# Patient Record
Sex: Male | Born: 1960 | Race: Black or African American | Hispanic: No | Marital: Married | Smoking: Never smoker
Health system: Southern US, Community
[De-identification: ages and names within clinical notes are randomized; demographics above are authoritative.]

---

## 2012-03-30 ENCOUNTER — Emergency Department (HOSPITAL_COMMUNITY)
Admission: EM | Admit: 2012-03-30 | Discharge: 2012-03-30 | Disposition: A | Discharge status: Home / Self Care | Payer: Worker's Compensation | Attending: Emergency Medicine | Admitting: Emergency Medicine

## 2012-03-30 ENCOUNTER — Emergency Department (HOSPITAL_COMMUNITY): Payer: Worker's Compensation

## 2012-03-30 ENCOUNTER — Encounter (HOSPITAL_COMMUNITY): Payer: Self-pay | Admitting: *Deleted

## 2012-03-30 DIAGNOSIS — S61412A Laceration without foreign body of left hand, initial encounter: Secondary | ICD-10-CM

## 2012-03-30 DIAGNOSIS — Y9269 Other specified industrial and construction area as the place of occurrence of the external cause: Secondary | ICD-10-CM | POA: Insufficient documentation

## 2012-03-30 DIAGNOSIS — Z23 Encounter for immunization: Secondary | ICD-10-CM | POA: Insufficient documentation

## 2012-03-30 DIAGNOSIS — S61209A Unspecified open wound of unspecified finger without damage to nail, initial encounter: Secondary | ICD-10-CM | POA: Insufficient documentation

## 2012-03-30 DIAGNOSIS — W268XXA Contact with other sharp object(s), not elsewhere classified, initial encounter: Secondary | ICD-10-CM | POA: Insufficient documentation

## 2012-03-30 LAB — RAPID URINE DRUG SCREEN, HOSP PERFORMED
Amphetamines: NOT DETECTED
Benzodiazepines: NOT DETECTED
Opiates: NOT DETECTED

## 2012-03-30 MED ORDER — TETANUS-DIPHTH-ACELL PERTUSSIS 5-2.5-18.5 LF-MCG/0.5 IM SUSP
0.5000 mL | Freq: Once | INTRAMUSCULAR | Status: AC
  Administered 2012-03-30: 0.5 mL via INTRAMUSCULAR
  Filled 2012-03-30: qty 0.5

## 2012-03-30 MED ORDER — OXYCODONE-ACETAMINOPHEN 5-325 MG PO TABS
1.0000 | ORAL_TABLET | Freq: Once | ORAL | Status: AC
  Administered 2012-03-30: 1 via ORAL
  Filled 2012-03-30: qty 1

## 2012-03-30 MED ORDER — LIDOCAINE HCL (PF) 1 % IJ SOLN: 30.0000 mL | Freq: Once | INTRAMUSCULAR | Status: DC

## 2012-03-30 MED ORDER — HYDROCODONE-ACETAMINOPHEN 5-500 MG PO TABS: 1.0000 | ORAL_TABLET | Freq: Four times a day (QID) | ORAL | Status: DC | PRN

## 2012-03-30 NOTE — ED Notes (Signed)
The pt has ha hand injury.  Lac tp the palmar surface of the lt hand.  He was at work and his hand was caught between 2 objects.????? He speaks broken  [place] .  Bleeding controlled with a bandage

## 2012-03-30 NOTE — ED Provider Notes (Signed)
History     CSN: [id number]  Arrival date & time 03/30/12  4098   First MD Initiated Contact with Patient 03/30/12 2014      Chief Complaint  Patient presents with  . Hand Injury    (Consider location/radiation/quality/duration/timing/severity/associated sxs/prior treatment) HPI  51 year old male presents with chief complaints of hand injury. 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 However, the washing machine was not working, therefore he was washing dishes by hand. He notice a stack of dishes was about to fall, when he reached his hand to try to stop it and his left thumb was caught against the edge of the machine.  Patient notice bleeding and laceration to the base of his left thumb. Also complaining of pain with movement. He described pain as a sharp throbbing sensation. He denies any other injuries.  Denies numbness. He denies wrist pain or elbow pain. He cannot recall his last tetanus shot.  History reviewed. No pertinent past medical history.  History reviewed. No pertinent past surgical history.  No family history on file.  History  Substance Use Topics  . Smoking status: Never Smoker   . Smokeless tobacco: Not on file  . Alcohol Use: No      Review of Systems  All other systems reviewed and are negative.    Allergies  Review of patient's allergies indicates no known allergies.  Home Medications  No current outpatient prescriptions on file.  BP 146/93  Pulse 58  Temp(Src) 98.2 F (36.8 C) (Oral)  Resp 14  SpO2 98%  Physical Exam  Nursing note and vitals reviewed. Constitutional: He appears well-developed and well-nourished. No distress.  HENT:  Head: Atraumatic.  Eyes: Conjunctivae are normal.  Neck: Neck supple.  Musculoskeletal:       Left hand: 1 cm horizontal laceration at the base of the thumb on the palmar region. Pain to palpation to MCP of the thumb with no obvious deformity. Sensation intact throughout. Brisk cap refill.  Decreased thumb flexion due to pain. normal strength with thumb extension. No foreign body seen so palpated. No active bleeding.  Left wrist with full range of motion, nontender. Left elbow with full range of motion, nontender.  Neurological: He is alert.  Skin: Skin is warm.    ED Course  Procedures (including critical care time)  Labs Reviewed - No data to display No results found.   No diagnosis found.  LACERATION REPAIR Performed by: Fayrene Helper Authorized byFayrene Helper Consent: Verbal consent obtained. Risks and benefits: risks, benefits and alternatives were discussed Consent given by: patient Patient identity confirmed: provided demographic data Prepped and Draped in normal sterile fashion Wound explored  Laceration Location: base of L thumb volarly  Laceration Length: 1cm  No Foreign Bodies seen or palpated  Anesthesia: local infiltration  Local anesthetic: lidocaine 2% w/out epinephrine  Anesthetic total: 3 ml  Irrigation method: syringe Amount of cleaning: standard  Skin closure: prolene 4.0  Number of sutures: 2  Technique: simple interrupted  Patient tolerance: Patient tolerated the procedure well with no immediate complications.  MDM  Laceration to base of L thumb.  Xray negative for fracture.  No evidence of tendon involvement.  Laceration were irrigated and sutured.  Wound dressing applied.  Will d/c with pain meds, and f/u instruction for suture removal.   9:51 PM This is a Worker's Comp case.  UDS obtain per request.  Pt stable to be discharge.     Fayrene Helper, PA-C 03/30/12 2127  Greta Doom  Laveda Norman, PA-C 03/30/12 2152

## 2012-03-30 NOTE — Discharge Instructions (Signed)
Return to ER in 10 days to have sutures removed.  If you notice increasing pain, pus drainage, or red streak extending from wound then return sooner.    Laceration Care, Adult A laceration is a cut or lesion that goes through all layers of the skin and into the tissue just beneath the skin. TREATMENT  Some lacerations may not require closure. Some lacerations may not be able to be closed due to an increased risk of infection. It is important to see your caregiver as soon as possible after an injury to minimize the risk of infection and maximize the opportunity for successful closure. If closure is appropriate, pain medicines may be given, if needed. The wound will be cleaned to help prevent infection. Your caregiver will use stitches (sutures), staples, wound glue (adhesive), or skin adhesive strips to repair the laceration. These tools bring the skin edges together to allow for faster healing and a better cosmetic outcome. However, all wounds will heal with a scar. Once the wound has healed, scarring can be minimized by covering the wound with sunscreen during the day for 1 full year. HOME CARE INSTRUCTIONS  For sutures or staples:  Keep the wound clean and dry.   If you were given a bandage (dressing), you should change it at least once a day. Also, change the dressing if it becomes wet or dirty, or as directed by your caregiver.   Wash the wound with soap and water 2 times a day. Rinse the wound off with water to remove all soap. Pat the wound dry with a clean towel.   After cleaning, apply a thin layer of the antibiotic ointment as recommended by your caregiver. This will help prevent infection and keep the dressing from sticking.   You may shower as usual after the first 24 hours. Do not soak the wound in water until the sutures are removed.   Only take over-the-counter or prescription medicines for pain, discomfort, or fever as directed by your caregiver.   Get your sutures or staples  removed as directed by your caregiver.  For skin adhesive strips:  Keep the wound clean and dry.   Do not get the skin adhesive strips wet. You may bathe carefully, using caution to keep the wound dry.   If the wound gets wet, pat it dry with a clean towel.   Skin adhesive strips will fall off on their own. You may trim the strips as the wound heals. Do not remove skin adhesive strips that are still stuck to the wound. They will fall off in time.  For wound adhesive:  You may briefly wet your wound in the shower or bath. Do not soak or scrub the wound. Do not swim. Avoid periods of heavy perspiration until the skin adhesive has fallen off on its own. After showering or bathing, gently pat the wound dry with a clean towel.   Do not apply liquid medicine, cream medicine, or ointment medicine to your wound while the skin adhesive is in place. This may loosen the film before your wound is healed.   If a dressing is placed over the wound, be careful not to apply tape directly over the skin adhesive. This may cause the adhesive to be pulled off before the wound is healed.   Avoid prolonged exposure to sunlight or tanning lamps while the skin adhesive is in place. Exposure to ultraviolet light in the first year will darken the scar.   The skin adhesive will usually remain  in place for 5 to 10 days, then naturally fall off the skin. Do not pick at the adhesive film.  You may need a tetanus shot if:  You cannot remember when you had your last tetanus shot.   You have never had a tetanus shot.  If you get a tetanus shot, your arm may swell, get red, and feel warm to the touch. This is common and not a problem. If you need a tetanus shot and you choose not to have one, there is a rare chance of getting tetanus. Sickness from tetanus can be serious. SEEK MEDICAL CARE IF:   You have redness, swelling, or increasing pain in the wound.   You see a red line that goes away from the wound.   You have  yellowish-white fluid (pus) coming from the wound.   You have a fever.   You notice a bad smell coming from the wound or dressing.   Your wound breaks open before or after sutures have been removed.   You notice something coming out of the wound such as wood or glass.   Your wound is on your hand or foot and you cannot move a finger or toe.  SEEK IMMEDIATE MEDICAL CARE IF:   Your pain is not controlled with prescribed medicine.   You have severe swelling around the wound causing pain and numbness or a change in color in your arm, hand, leg, or foot.   Your wound splits open and starts bleeding.   You have worsening numbness, weakness, or loss of function of any joint around or beyond the wound.   You develop painful lumps near the wound or on the skin anywhere on your body.  MAKE SURE YOU:   Understand these instructions.   Will watch your condition.   Will get help right away if you are not doing well or get worse.  Document Released: 12/17/2005 Document Revised: 12/06/2011 Document Reviewed: 06/12/2011 [facility] Patient Information 2012 [place], [place].

## 2012-03-30 NOTE — ED Provider Notes (Signed)
Medical screening examination/treatment/procedure(s) were performed by non-physician practitioner and as supervising physician I was immediately available for consultation/collaboration.  Cheri Guppy, MD 03/30/12 [phone]

## 2012-04-09 ENCOUNTER — Encounter (HOSPITAL_COMMUNITY): Payer: Self-pay | Admitting: *Deleted

## 2012-04-09 ENCOUNTER — Emergency Department (HOSPITAL_COMMUNITY)
Admission: EM | Admit: 2012-04-09 | Discharge: 2012-04-09 | Disposition: A | Discharge status: Home / Self Care | Payer: Worker's Compensation | Attending: Emergency Medicine | Admitting: Emergency Medicine

## 2012-04-09 DIAGNOSIS — Z4802 Encounter for removal of sutures: Secondary | ICD-10-CM

## 2012-04-09 NOTE — ED Provider Notes (Signed)
History     CSN: [id number]  Arrival date & time 04/09/12  1478   First MD Initiated Contact with Patient 04/09/12 2109      Chief Complaint  Patient presents with  . Suture / Staple Removal    (Consider location/radiation/quality/duration/timing/severity/associated sxs/prior treatment) HPI Comments: Patient presents for suture removal of stitches placed between left thumb and index finger 10 days ago. Patient denies complications with healing including redness, worsening pain or swelling, or drainage. No fever.  Patient is a 51 y.o. male presenting with suture removal. The history is provided by the patient.  Suture / Staple Removal  The sutures were placed 7 to 10 days ago. Treatments since wound repair include regular soap and water washings.    History reviewed. No pertinent past medical history.  History reviewed. No pertinent past surgical history.  History reviewed. No pertinent family history.  History  Substance Use Topics  . Smoking status: Never Smoker   . Smokeless tobacco: Not on file  . Alcohol Use: No      Review of Systems  Musculoskeletal: Negative for arthralgias.  Skin: Positive for wound. Negative for color change.  Neurological: Negative for weakness and numbness.    Allergies  Review of patient's allergies indicates no known allergies.  Home Medications   Current Outpatient Rx  Name Route Sig Dispense Refill  . ACETAMINOPHEN 500 MG PO TABS Oral Take 1,000 mg by mouth every 6 (six) hours as needed. For pain      BP 125/76  Pulse 62  Temp(Src) 98.9 F (37.2 C) (Oral)  Resp 18  SpO2 100%  Physical Exam  Nursing note and vitals reviewed. Constitutional: He is oriented to person, place, and time. He appears well-developed and well-nourished.  HENT:  Head: Normocephalic and atraumatic.  Eyes: Conjunctivae are normal.  Neck: Normal range of motion. Neck supple.  Pulmonary/Chest: No respiratory distress.  Musculoskeletal:       2  Prolene stitches in place and a well-healing 2 cm wound in web space between first and second fingers of left hand. No surrounding cellulitis or erythema. No drainage.  Neurological: He is alert and oriented to person, place, and time.  Skin: Skin is warm and dry.  Psychiatric: He has a normal mood and affect.    ED Course  Procedures (including critical care time)  Labs Reviewed - No data to display No results found.   1. Visit for suture removal    9:16 PM Patient seen and examined.   Vital signs reviewed and are as follows: Filed Vitals:   04/09/12 1955  BP: 125/76  Pulse: 62  Temp: 98.9 F (37.2 C)  Resp: 18   2 Prolene stitches removed from left hand between thumb and index finger. Patient tolerated well without difficulty. Counseled on scar minimization.  Counseled on signs and symptoms to return. Patient verbalizes understanding and agrees with plan.  MDM  Uncomplicated suture removal.        Renne Crigler, PA 04/09/12 2322

## 2012-04-09 NOTE — Discharge Instructions (Signed)
Please read and follow all provided instructions.  Your diagnoses today include:  1. Visit for suture removal     Tests performed today include:  Vital signs. See below for your results today.   Medications prescribed:   None  Take any prescribed medications only as directed.  Home care instructions:  Follow any educational materials contained in this packet.  Follow-up instructions: Please follow-up with your primary care provider as needed.  Return instructions:   Please return to the Emergency Department if you experience worsening symptoms.   Please return if you have any other emergent concerns.  Additional Information:  Your vital signs today were: BP 125/76  Pulse 62  Temp(Src) 98.9 F (37.2 C) (Oral)  Resp 18  SpO2 100% If your blood pressure (BP) was elevated above 135/85 this visit, please have this repeated by your doctor within one month. -------------- No Primary Care Doctor Call [facility]  [phone] Other agencies that provide inexpensive medical care    [facility]  [phone]    [facility]  [phone]    [facility]  [phone]    [facility]  [phone]    [facility]  [phone]    [facility]  [phone] -------------- RESOURCE GUIDE:  Dental Problems  Patients with Medicaid: [facility] [phone] [address]                                            [phone] [address] Phone:  [phone]                                                   Phone:  [phone]  If unable to pay or uninsured, contact:  [facility] or [facility]. to become qualified for the adult dental clinic.  Chronic Pain Problems Contact [facility]  [phone] Patients need to be referred by their primary care doctor.  Insufficient Money for Medicine Contact [employer]:  call "211" or [facility]  [phone].  Psychological Services [facility]  [phone] [facility]  [phone] [facility]   [phone] (emergency services [phone])  Substance Abuse Resources [facility]  [phone] [facility] [phone] [facility] [phone] Floydene Flock [phone] Residential & Outpatient Substance Abuse Program  [phone]  Abuse/Neglect [facility] Child Abuse Hotline [phone] [facility] Child Abuse Hotline 226-567-1778 (After Hours)  Emergency Shelter [facility] [phone]  Maternity Homes [facility] at [facility] [place] [facility] [place] [phone] [place] [phone]  [facility] Resources  [facility]     [employer]                          [facility] [address] [place]                       [address]      [address] [place] [address]  Blondell Reveal Phone:  [phone]                                   Phone:  [phone]                 Phone:  [phone]  [facility] Mental [facility] Phone:  [phone]  [facility] Child Abuse Hotline 6705327796 [phone] (After Hours)

## 2012-04-09 NOTE — ED Provider Notes (Signed)
Medical screening examination/treatment/procedure(s) were performed by non-physician practitioner and as supervising physician I was immediately available for consultation/collaboration.   Gwyneth Sprout, MD 04/09/12 [phone]

## 2012-04-09 NOTE — ED Notes (Signed)
Pt states understanding of discharge instructions and continuing care

## 2012-04-09 NOTE — ED Notes (Signed)
Pt had stitches put in his (L) hand 10 days ago.  Here for removal.  No drainage or redness noted.

## 2013-02-22 IMAGING — CR DG HAND COMPLETE 3+V*L*
3 series · 3 of 3 positions shown · non-contrast
Comparison: None

CLINICAL DATA: Laceration.

LEFT HAND - COMPLETE 3+ VIEW

[x hand pa left]
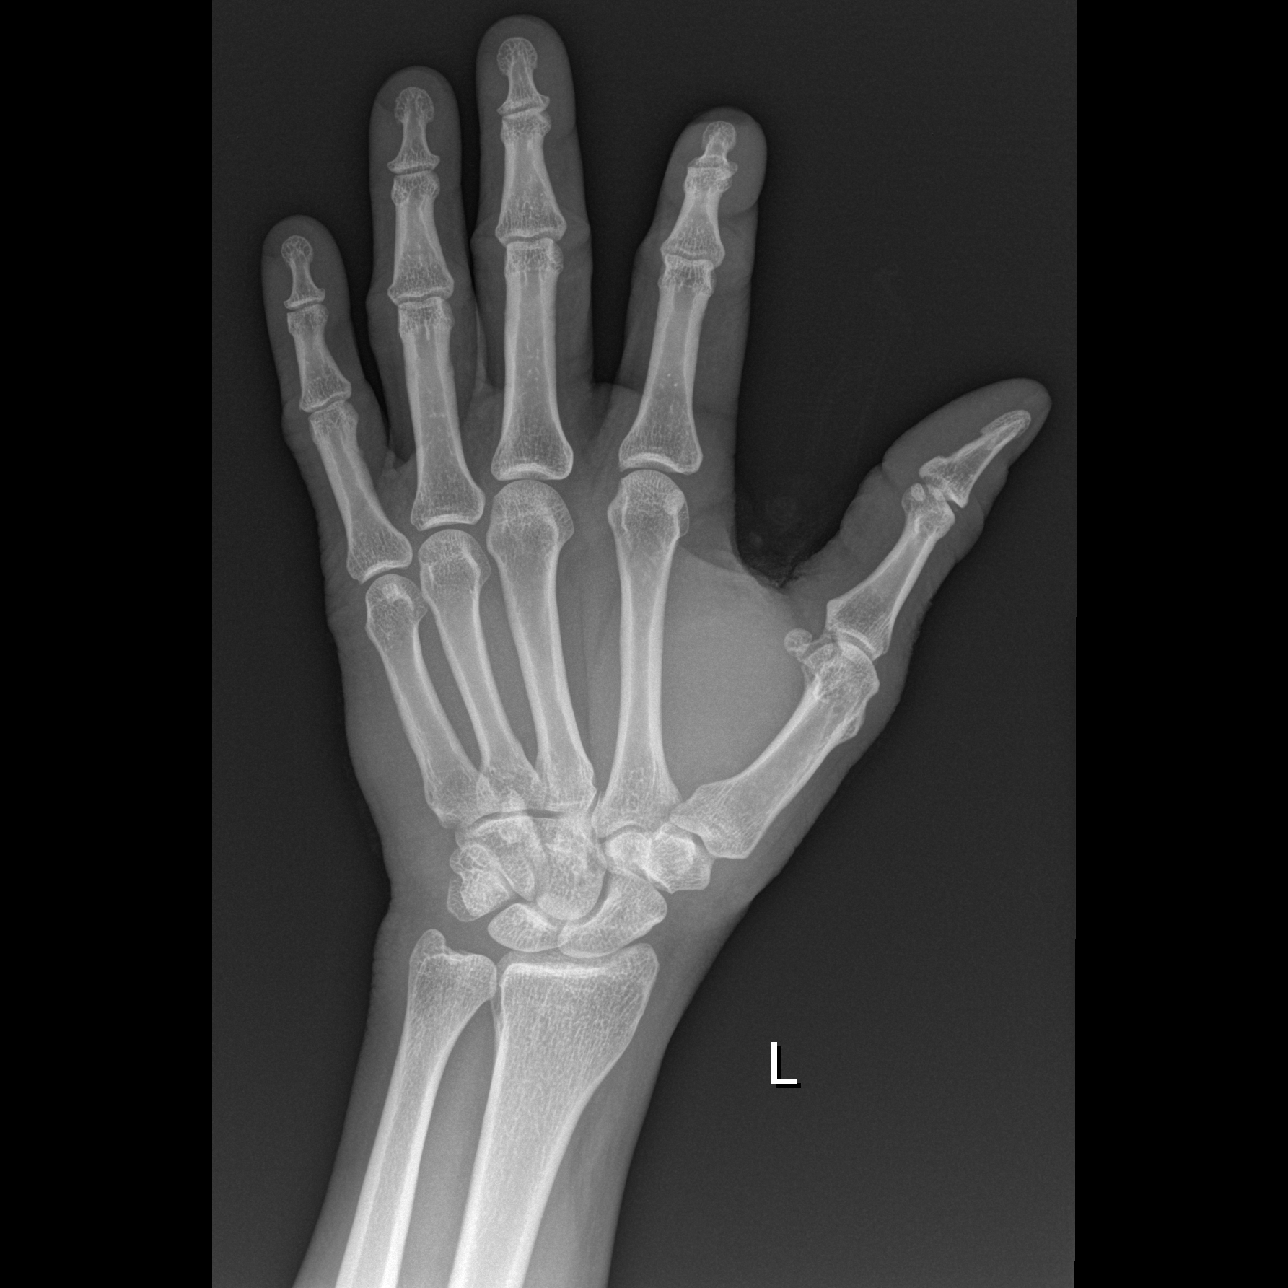

[x hand oblique left]
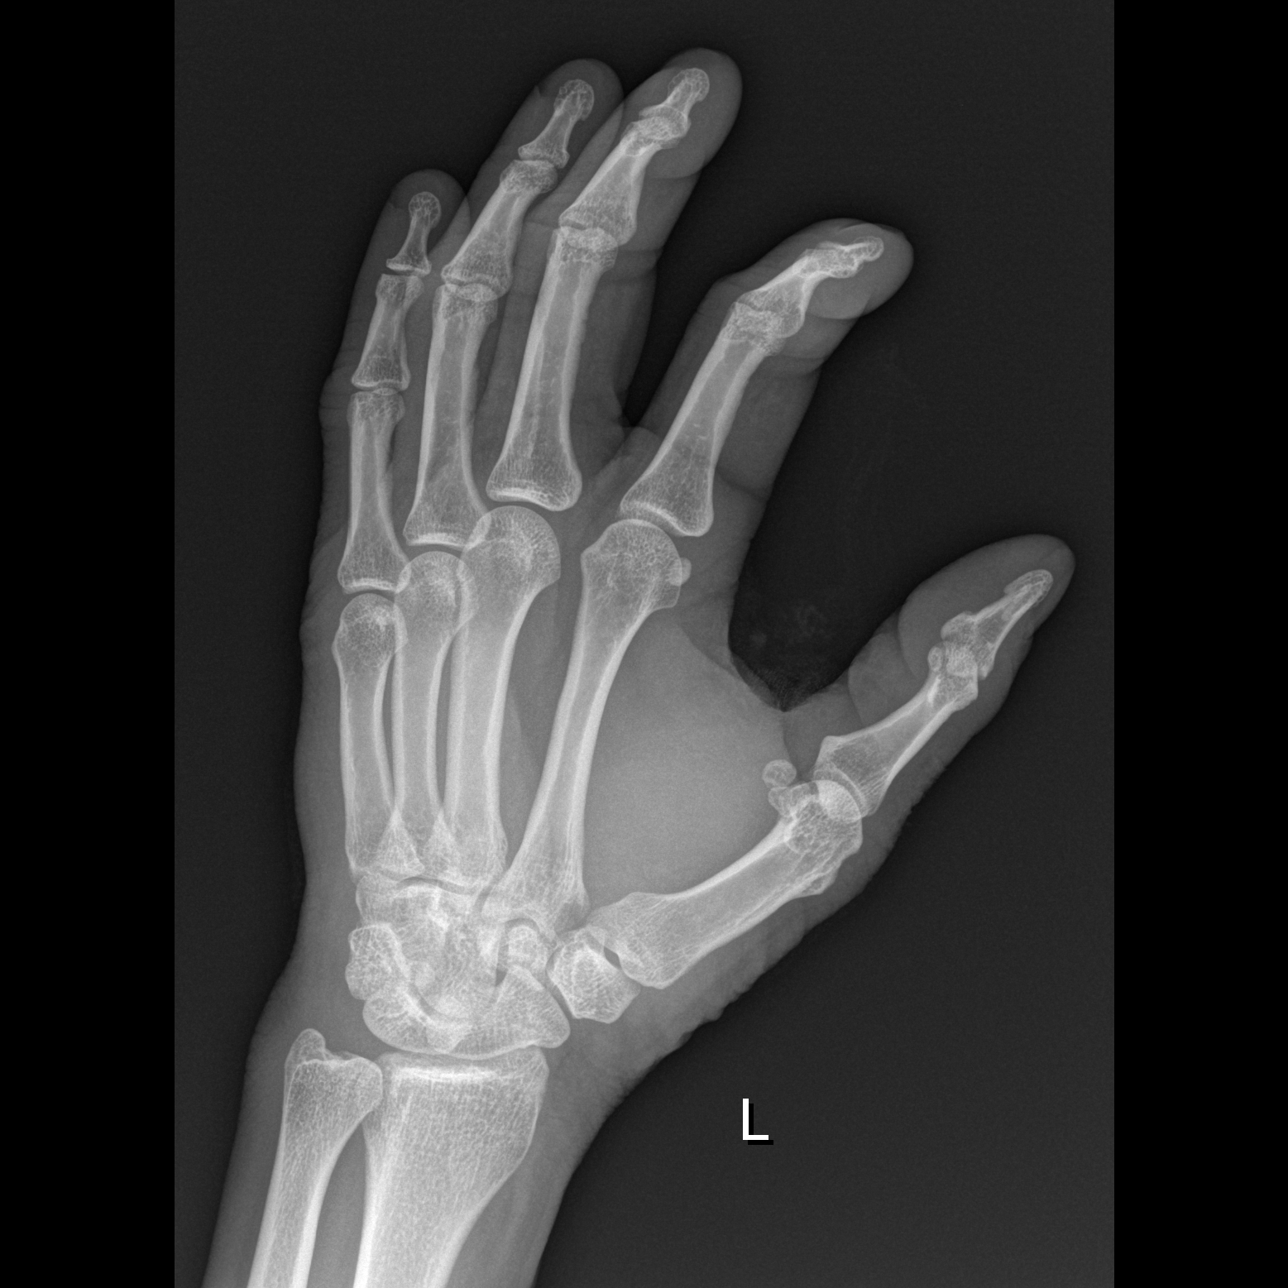

[x hand lat left]
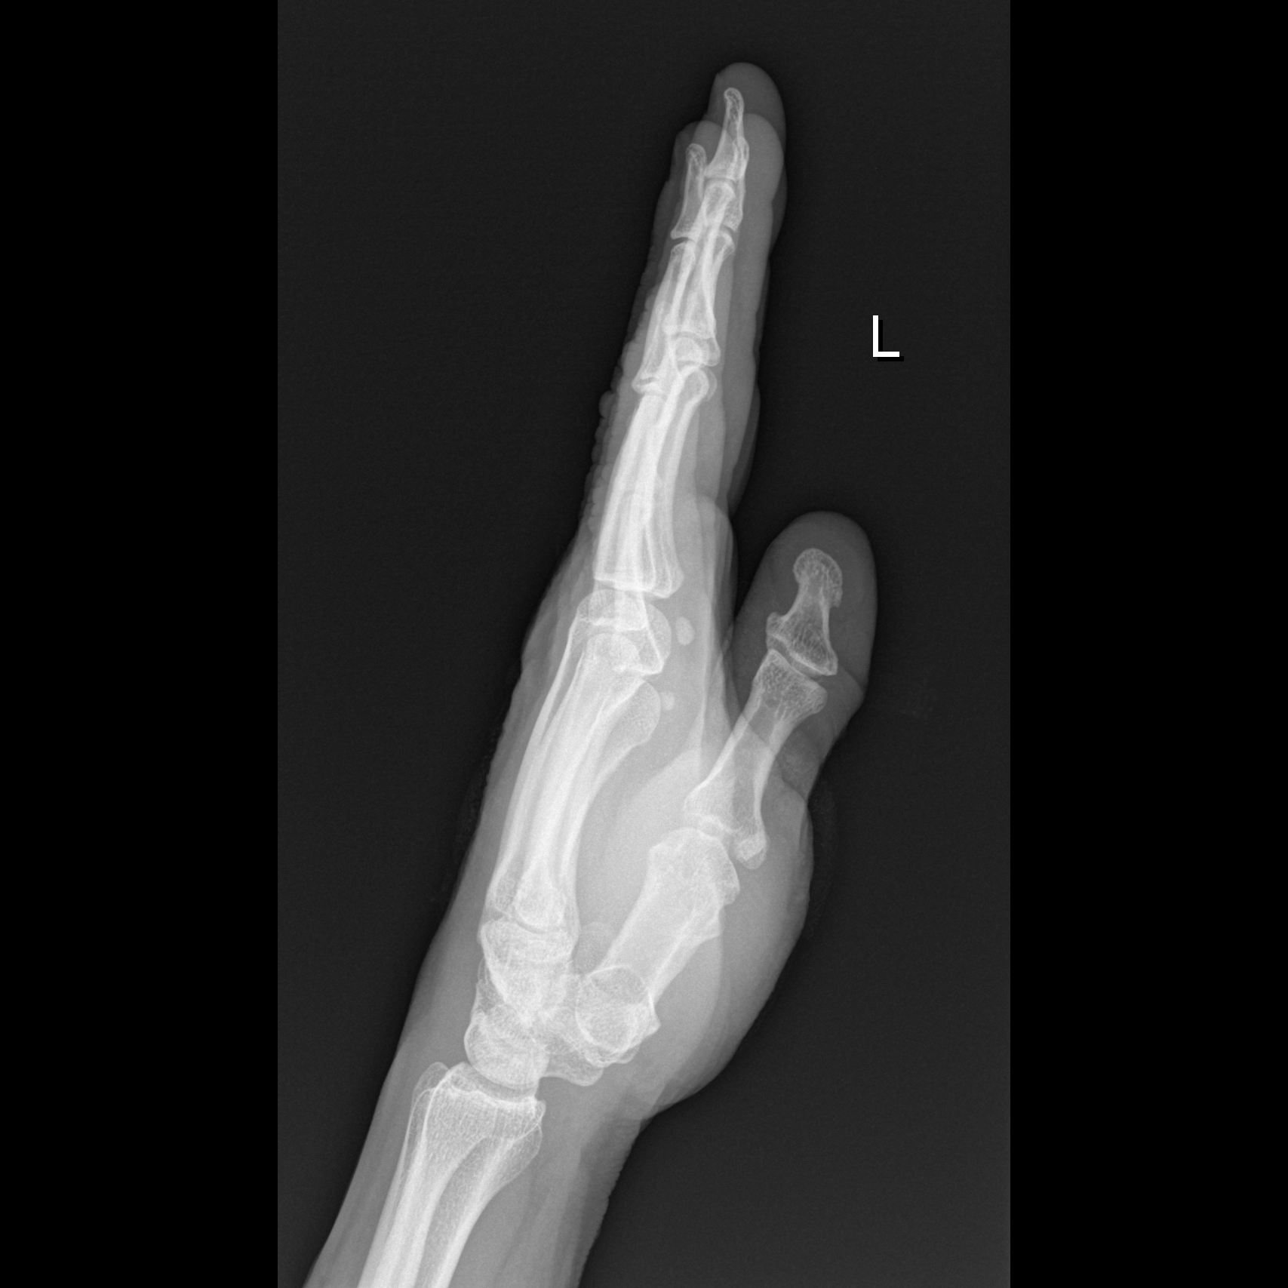

[3 of 3 positions shown; findings below may reference images not displayed]

FINDINGS: The joint spaces are maintained.  No fracture or
radiopaque foreign body.
IMPRESSION: No acute bony findings.

## 2019-12-31 ENCOUNTER — Other Ambulatory Visit: Payer: Self-pay

## 2019-12-31 ENCOUNTER — Encounter (HOSPITAL_BASED_OUTPATIENT_CLINIC_OR_DEPARTMENT_OTHER): Payer: Self-pay | Admitting: *Deleted

## 2019-12-31 ENCOUNTER — Emergency Department (HOSPITAL_BASED_OUTPATIENT_CLINIC_OR_DEPARTMENT_OTHER)
Admission: EM | Admit: 2019-12-31 | Discharge: 2019-12-31 | Disposition: A | Discharge status: Home / Self Care | Payer: Self-pay | Attending: Emergency Medicine | Admitting: Emergency Medicine

## 2019-12-31 DIAGNOSIS — S00451A Superficial foreign body of right ear, initial encounter: Secondary | ICD-10-CM | POA: Insufficient documentation

## 2019-12-31 DIAGNOSIS — Y999 Unspecified external cause status: Secondary | ICD-10-CM | POA: Insufficient documentation

## 2019-12-31 DIAGNOSIS — Y939 Activity, unspecified: Secondary | ICD-10-CM | POA: Insufficient documentation

## 2019-12-31 DIAGNOSIS — Y929 Unspecified place or not applicable: Secondary | ICD-10-CM | POA: Insufficient documentation

## 2019-12-31 DIAGNOSIS — Y33XXXA Other specified events, undetermined intent, initial encounter: Secondary | ICD-10-CM | POA: Insufficient documentation

## 2019-12-31 NOTE — ED Provider Notes (Signed)
[facility] Provider Note   CSN: [id number] Arrival date & time: 12/31/19  2142     History Chief Complaint  Patient presents with  . Foreign Body in [place] is a 58 y.o. male.  HPI     58yo male presents with concern for right ear foreign body.  Was trying to clean ears with cotton and it got stuck in ear 2 days ago. A friend looked in ear but felt it was too deep to retrieve and recommended going to physician. No other symptoms, no fever, congestion, cough or ear pain.     History reviewed. No pertinent past medical history.  There are no problems to display for this patient.   History reviewed. No pertinent surgical history.     No family history on file.  Social History   Tobacco Use  . Smoking status: Never Smoker  . Smokeless tobacco: Never Used  Substance Use Topics  . Alcohol use: No  . Drug use: Never    Home Medications Prior to Admission medications   Medication Sig Start Date End Date Taking? Authorizing Provider  acetaminophen (TYLENOL) 500 MG tablet Take 1,000 mg by mouth every 6 (six) hours as needed. For pain    [provider]    Allergies    Patient has no known allergies.  Review of Systems   Review of Systems  Constitutional: Negative for fever.  HENT: Negative for congestion, ear pain, rhinorrhea and sinus pain.   Respiratory: Negative for cough.     Physical Exam Updated Vital Signs BP 124/82   Pulse 72   Temp 98.5 F (36.9 C) (Oral)   Resp 16   Ht 5\' 6"  (1.676 m)   Wt 81.6 kg   SpO2 100%   BMI 29.05 kg/m   Physical Exam Vitals and nursing note reviewed.  Constitutional:      General: He is not in acute distress.    Appearance: He is well-developed. He is not diaphoretic.  HENT:     Head: Normocephalic and atraumatic.     Ears:     Comments: Mild irritation/abrasion inferior portion right ear canal TM intact, no middle ear effusion Eyes:   Conjunctiva/sclera: Conjunctivae normal.  Cardiovascular:     Rate and Rhythm: Normal rate.  Pulmonary:     Effort: Pulmonary effort is normal. No respiratory distress.  Musculoskeletal:     Cervical back: Normal range of motion.  Skin:    General: Skin is warm and dry.  Neurological:     Mental Status: He is alert and oriented to person, place, and time.     ED Results / Procedures / Treatments   Labs (all labs ordered are listed, but only abnormal results are displayed) Labs Reviewed - No data to display  EKG None  Radiology No results found.  Procedures Procedures (including critical care time)  Medications Ordered in ED Medications - No data to display  ED Course  I have reviewed the triage vital signs and the nursing notes.  Pertinent labs & imaging results that were available during my care of the patient were reviewed by me and considered in my medical decision making (see chart for details).    MDM Rules/Calculators/A&P                      59yo male presents with concern for right ear foreign body. Cotton present in ear on arrival, irrigated by nursing. Pt  no longer with signs of foreign body on my exam.  Mild irritation of canal, do not see indication for abx. Recommend prn follow up.   Final Clinical Impression(s) / ED Diagnoses Final diagnoses:  Foreign body of right external ear    Rx / DC Orders ED Discharge Orders    None       Alvira Monday, MD 01/02/20 2104

## 2019-12-31 NOTE — ED Notes (Signed)
Washed ear , tip of qtip retrieved

## 2019-12-31 NOTE — ED Triage Notes (Signed)
c/o qtip in right ear x 2 days

## 2020-05-17 ENCOUNTER — Encounter (HOSPITAL_BASED_OUTPATIENT_CLINIC_OR_DEPARTMENT_OTHER): Payer: Self-pay | Admitting: *Deleted

## 2020-05-17 ENCOUNTER — Other Ambulatory Visit: Payer: Self-pay

## 2020-05-17 ENCOUNTER — Emergency Department (HOSPITAL_BASED_OUTPATIENT_CLINIC_OR_DEPARTMENT_OTHER): Payer: Self-pay

## 2020-05-17 ENCOUNTER — Emergency Department (HOSPITAL_BASED_OUTPATIENT_CLINIC_OR_DEPARTMENT_OTHER)
Admission: EM | Admit: 2020-05-17 | Discharge: 2020-05-17 | Disposition: A | Discharge status: Home / Self Care | Payer: Self-pay | Attending: Emergency Medicine | Admitting: Emergency Medicine

## 2020-05-17 DIAGNOSIS — M545 Low back pain: Secondary | ICD-10-CM | POA: Insufficient documentation

## 2020-05-17 DIAGNOSIS — G8929 Other chronic pain: Secondary | ICD-10-CM | POA: Insufficient documentation

## 2020-05-17 DIAGNOSIS — N453 Epididymo-orchitis: Secondary | ICD-10-CM | POA: Insufficient documentation

## 2020-05-17 LAB — URINALYSIS, ROUTINE W REFLEX MICROSCOPIC
Bilirubin Urine: NEGATIVE
Glucose, UA: NEGATIVE mg/dL
Hgb urine dipstick: NEGATIVE
Ketones, ur: NEGATIVE mg/dL
Nitrite: NEGATIVE
Protein, ur: NEGATIVE mg/dL
Specific Gravity, Urine: 1.02 (ref 1.005–1.030)
pH: 5.5 (ref 5.0–8.0)

## 2020-05-17 LAB — URINALYSIS, MICROSCOPIC (REFLEX)

## 2020-05-17 MED ORDER — NAPROXEN 500 MG PO TABS: 500.0000 mg | ORAL_TABLET | Freq: Two times a day (BID) | ORAL | 0 refills | Status: DC | PRN

## 2020-05-17 MED ORDER — OXYCODONE-ACETAMINOPHEN 5-325 MG PO TABS
1.0000 | ORAL_TABLET | Freq: Once | ORAL | Status: AC
  Administered 2020-05-17: 1 via ORAL
  Filled 2020-05-17: qty 1

## 2020-05-17 MED ORDER — LEVOFLOXACIN 500 MG PO TABS: 500.0000 mg | ORAL_TABLET | Freq: Every day | ORAL | 0 refills | Status: AC

## 2020-05-17 MED ORDER — KETOROLAC TROMETHAMINE 60 MG/2ML IM SOLN
30.0000 mg | Freq: Once | INTRAMUSCULAR | Status: AC
  Administered 2020-05-17: 30 mg via INTRAMUSCULAR
  Filled 2020-05-17: qty 2

## 2020-05-17 NOTE — ED Notes (Signed)
ED Provider at bedside. 

## 2020-05-17 NOTE — ED Triage Notes (Signed)
Left testicle is bigger than the right. He fell last night in the bathroom. He has an injury to his left testicle and his lower back.

## 2020-05-17 NOTE — Discharge Instructions (Signed)
Take anti-inflammatory such as the prescribed naproxen or ibuprofen for pain control.  Recommend taking the antibiotic as prescribed to treat your suspected epididymitis/orchitis.  If you develop fever, worsening pain, swelling, return to ER for reassessment.

## 2020-05-17 NOTE — ED Notes (Signed)
Interpreter used to explain narcotic admin policy that pt should not drive for up to 4 hrs. Pt given the option to wait here or have someone come get him. Pt understands this is considered the same as drinking and driving. Pt stated he would call someone to come get him and wait in the lobby. Interpreter explained to pt to call this RN when ride was here before leaving. Pt agreeable.

## 2020-05-17 NOTE — ED Notes (Signed)
Pt returned from US

## 2020-05-17 NOTE — ED Notes (Signed)
Interpreter used to explain prescriptions and discharge instructions. Pt verbalized understanding and is appreciative.

## 2020-05-18 NOTE — ED Provider Notes (Signed)
[facility] EMERGENCY DEPARTMENT Provider Note   CSN: [id number] Arrival date & time: 05/17/20  1346     History Chief Complaint  Patient presents with  . Testicular Injury  . Back Injury    Chase Shepard is a 59 y.o. male.  Presents to ER with concern for left testicle pain.  Patient reports over the last couple days he has noted some tenderness, swelling to his left testicle.  Denies fever, dysuria, incontinence, retention.  States that he has chronic low back pain, ongoing for months to years, no recent changes, no recent injuries.  No associated urinary retention, urinary incontinence, bowel incontinence, numbness or weakness.  States he is not recently been sexually active, denies prior STD history, no penile discharge.  Initial triage reported fall in bathroom, after using language line translator to further clarify, patient reports that he had no fall and has not had any recent injury to back, testicles or abdomen.  Denies any chronic medical conditions.   Language line interpreter used throughout entirety of history, physical exam, discussion of results, discharge plan.    HPI     History reviewed. No pertinent past medical history.  There are no problems to display for this patient.   History reviewed. No pertinent surgical history.     No family history on file.  Social History   Tobacco Use  . Smoking status: Never Smoker  . Smokeless tobacco: Never Used  Substance Use Topics  . Alcohol use: No  . Drug use: Never    Home Medications Prior to Admission medications   Medication Sig Start Date End Date Taking? Authorizing Provider  acetaminophen (TYLENOL) 500 MG tablet Take 1,000 mg by mouth every 6 (six) hours as needed. For pain    [provider]  levofloxacin (LEVAQUIN) 500 MG tablet Take 1 tablet (500 mg total) by mouth daily for 10 days. 05/17/20 05/27/20  [name], MD  naproxen (NAPROSYN) 500 MG tablet Take 1 tablet (500  mg total) by mouth 2 (two) times daily as needed. 05/17/20   [name], MD    Allergies    Patient has no known allergies.  Review of Systems   Review of Systems  Constitutional: Negative for chills and fever.  HENT: Negative for ear pain and sore throat.   Eyes: Negative for pain and visual disturbance.  Respiratory: Negative for cough and shortness of breath.   Cardiovascular: Negative for chest pain and palpitations.  Gastrointestinal: Negative for abdominal pain and vomiting.  Genitourinary: Positive for scrotal swelling and testicular pain. Negative for dysuria and hematuria.  Musculoskeletal: Negative for arthralgias and back pain.  Skin: Negative for color change and rash.  Neurological: Negative for seizures and syncope.  All other systems reviewed and are negative.   Physical Exam Updated Vital Signs BP (!) 172/102 (BP Location: Right Arm)   Pulse 75   Temp 98.5 F (36.9 C) (Oral)   Resp 18   Ht 5\' 6"  (1.676 m)   Wt 81.6 kg   SpO2 100%   BMI 29.04 kg/m   Physical Exam Vitals and nursing note reviewed.  Constitutional:      Appearance: He is well-developed.  HENT:     Head: Normocephalic and atraumatic.  Eyes:     Extraocular Movements: Extraocular movements intact.     Conjunctiva/sclera: Conjunctivae normal.  Cardiovascular:     Rate and Rhythm: Normal rate and regular rhythm.     Pulses: Normal pulses.  Pulmonary:  Effort: Pulmonary effort is normal. No respiratory distress.  Abdominal:     Palpations: Abdomen is soft.     Tenderness: There is no abdominal tenderness.  Genitourinary:    Comments: On visual inspection, mild swelling noted to left testicle, on palpation, there is normal lie, TTP noted in left testicle, no TTP in right testicle; penis appears normal, no discharge noted at tip Musculoskeletal:     Cervical back: Neck supple.     Comments: 5/5 strength bilateral lower extremities, sensation to light touch intact in bilateral  lower extremities  Skin:    General: Skin is warm and dry.  Neurological:     Mental Status: He is alert.  Psychiatric:        Mood and Affect: Mood normal.        Behavior: Behavior normal.     ED Results / Procedures / Treatments   Labs (all labs ordered are listed, but only abnormal results are displayed) Labs Reviewed  URINALYSIS, ROUTINE W REFLEX MICROSCOPIC - Abnormal; Notable for the following components:      Result Value   Leukocytes,Ua SMALL (*)    All other components within normal limits  URINALYSIS, MICROSCOPIC (REFLEX) - Abnormal; Notable for the following components:   Bacteria, UA FEW (*)    All other components within normal limits  GC/CHLAMYDIA PROBE AMP ([place]) NOT AT [facility]    EKG None  Radiology US SCROTUM W/DOPPLER  Result Date: 05/17/2020 CLINICAL DATA:  59 year old male status post fall last night with left testicular injury. EXAM: SCROTAL ULTRASOUND DOPPLER ULTRASOUND OF THE TESTICLES TECHNIQUE: Complete ultrasound examination of the testicles, epididymis, and other scrotal structures was performed. Color and spectral Doppler ultrasound were also utilized to evaluate blood flow to the testicles. COMPARISON:  None. FINDINGS: Right testicle Measurements: 4.4 x 2.2 x 2.7 cm (volume 14 mL). No mass or microlithiasis visualized. Left testicle Measurements: 4.5 x 2.7 x 3.0 cm (volume 19 mL). Echogenicity within normal limits, no discrete testicular injury identified. No mass or microlithiasis visualized. Right epididymis:  Normal in size and appearance. Left epididymis: Asymmetrically enlarged and hypervascular (image 46). Hypervascular left spermatic cord also. Hydrocele: Positive for moderate sized septated left-side hydrocele (image 34). Low level internal echoes. Varicocele:  None visualized. Pulsed Doppler interrogation of both testes demonstrates normal low resistance arterial and venous waveforms bilaterally. The left testis does appear mildly  hypervascular (image 53). IMPRESSION: 1. Negative for discrete testicular injury or torsion. 2. Positive for left-side septated hydrocele, which might be sequelae of post traumatic hemorrhage, or could be postinflammatory (see #3). 3. Positive also for asymmetrically enlarged and hyperemic left epididymis, and some hyperemia of the left testis, which are suspicious for Acute Epididymo-orchitis. Electronically Signed   By: Odessa Fleming M.D.   On: 05/17/2020 17:08    Procedures Procedures (including critical care time)  Medications Ordered in ED Medications  ketorolac (TORADOL) injection 30 mg (30 mg Intramuscular Given 05/17/20 1818)  oxyCODONE-acetaminophen (PERCOCET/ROXICET) 5-325 MG per tablet 1 tablet (1 tablet Oral Given 05/17/20 1817)    ED Course  I have reviewed the triage vital signs and the nursing notes.  Pertinent labs & imaging results that were available during my care of the patient were reviewed by me and considered in my medical decision making (see chart for details).    MDM Rules/Calculators/A&P                      59 year old male who presented to ER  with concern for left testicle pain.  On initial triage note reported trauma, however this information was obtained without language line translator, patient had some broken [place] but clearly language barrier.  Utilized Art therapist, clarified patient did not have any associated trauma.  Endorse some chronic back pain however no recent changes, no recent trauma, no red flag symptoms.  His exam is concerning for epididymoorchitis.  Ultrasound confirmed epididymoorchitis, rule out torsion.  UA demonstrated small leukocytes, few bacteria.  As patient denies being sexually active, denies prior history of STDs, no current STD symptoms, per CDC guidelines will treat with monotherapy with levofloxacin for 10 days.  Reviewed return precautions, discharged home.  Final Clinical Impression(s) / ED Diagnoses Final diagnoses:    Orchitis and epididymitis    Rx / DC Orders ED Discharge Orders         Ordered    levofloxacin (LEVAQUIN) 500 MG tablet  Daily     05/17/20 1825    naproxen (NAPROSYN) 500 MG tablet  2 times daily PRN     05/17/20 1825           Lucrezia Starch, MD 05/18/20 1229

## 2020-05-19 LAB — GC/CHLAMYDIA PROBE AMP (~~LOC~~) NOT AT ARMC
Chlamydia: NEGATIVE
Comment: NEGATIVE
Comment: NORMAL
Neisseria Gonorrhea: NEGATIVE

## 2021-04-11 IMAGING — US US SCROTUM W/ DOPPLER COMPLETE
1 series · 13 of 25 positions shown · non-contrast
Comparison: None.

CLINICAL DATA: 59-year-old male status post fall last night with
left testicular injury.

EXAM:
SCROTAL ULTRASOUND
DOPPLER ULTRASOUND OF THE TESTICLES
TECHNIQUE: Complete ultrasound examination of the testicles, epididymis, and
other scrotal structures was performed. Color and spectral Doppler
ultrasound were also utilized to evaluate blood flow to the
testicles.

[Series 1: us scrotum w/ doppler complete · 13 of 52 slices shown]
[im 1/52]
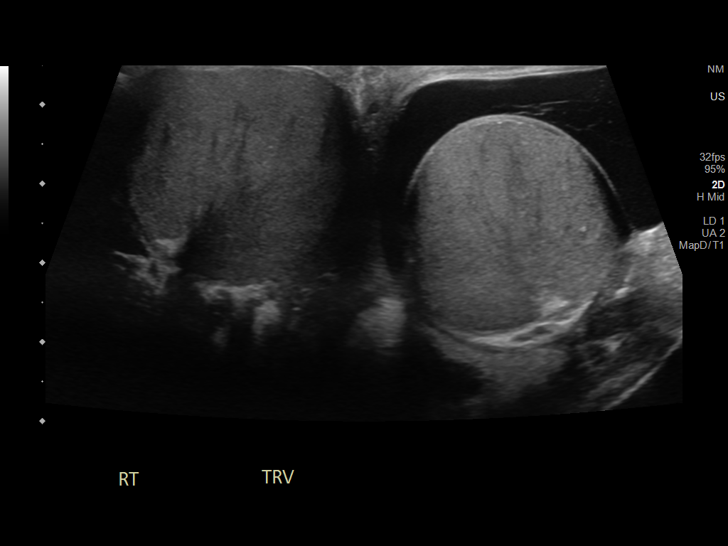
[im 5/52]
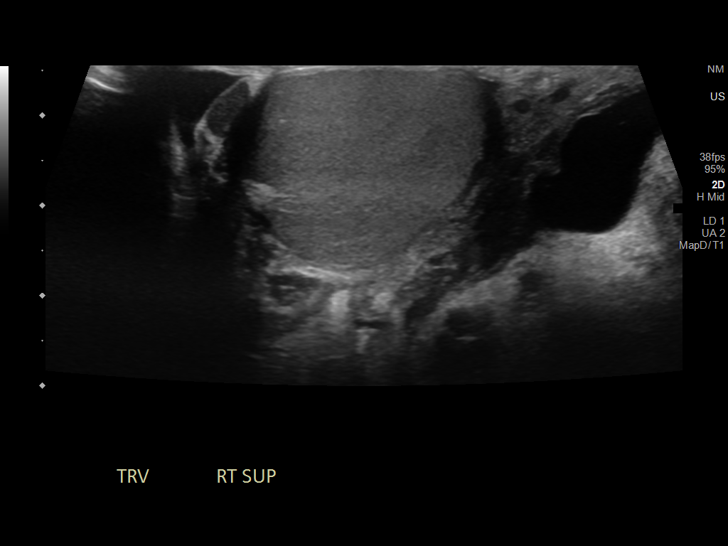
[im 9/52]
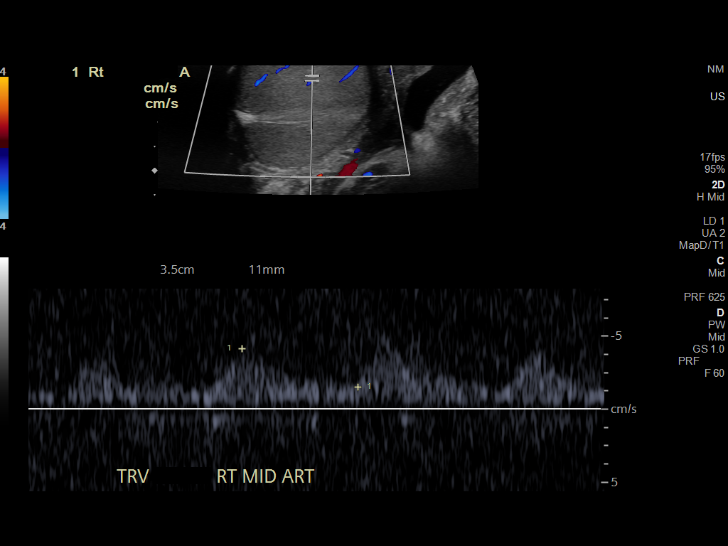
[im 13/52]
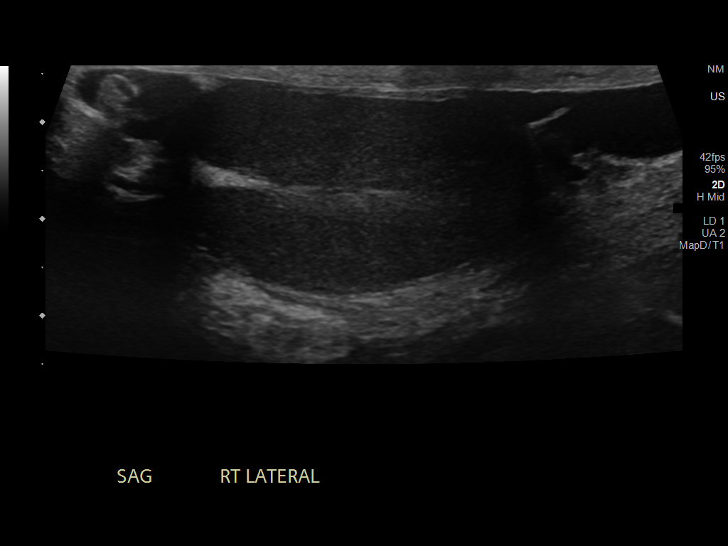
[im 18/52]
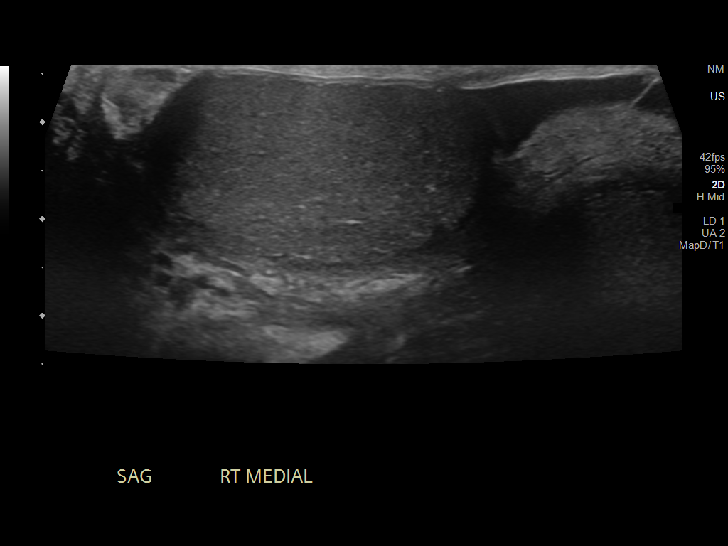
[im 22/52]
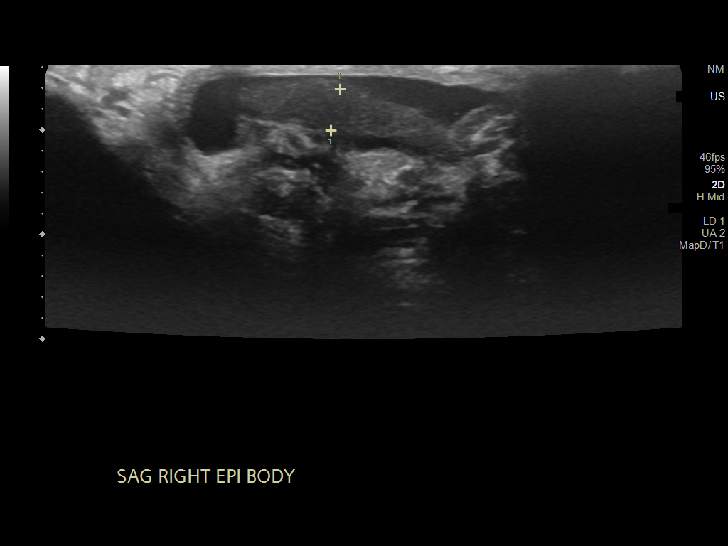
[im 26/52]
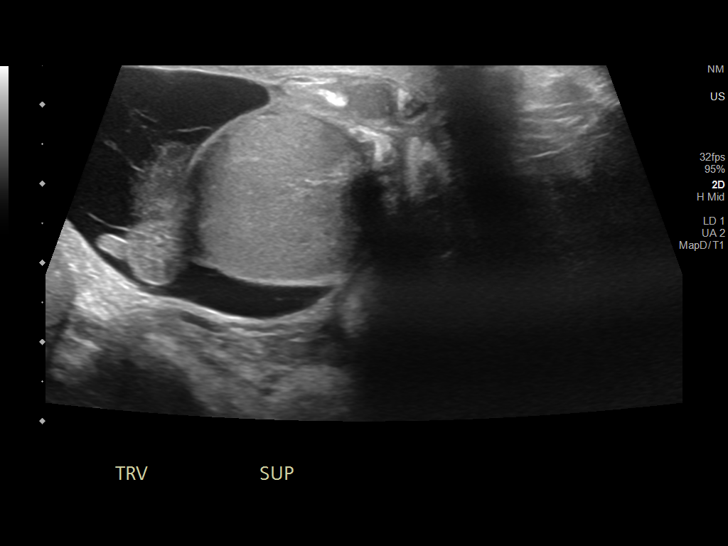
[im 30/52]
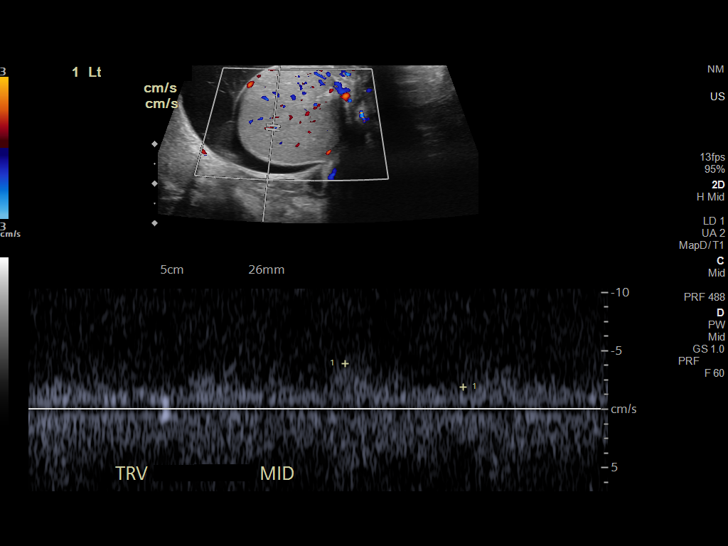
[im 35/52]
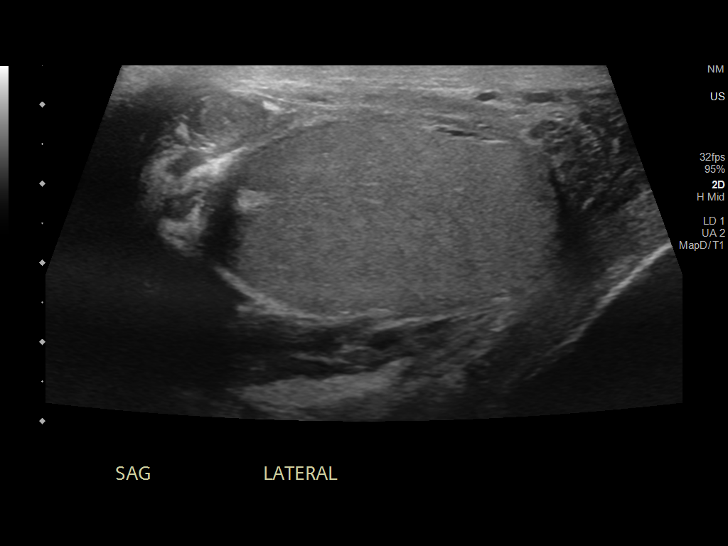
[im 39/52]
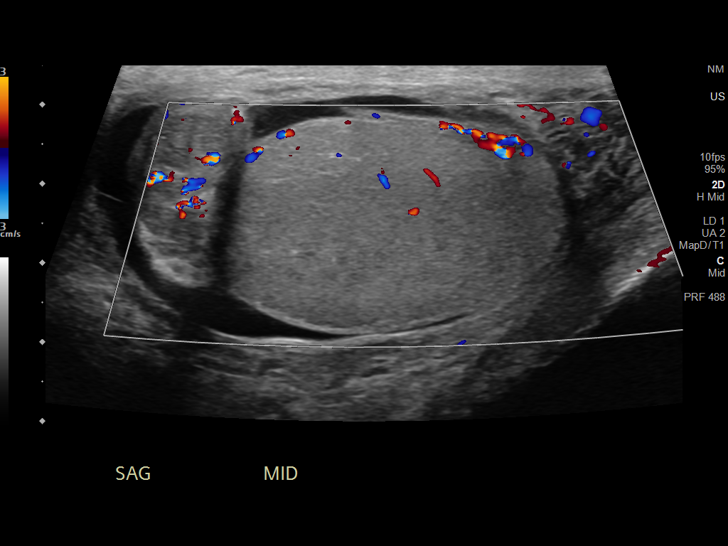
[im 43/52]
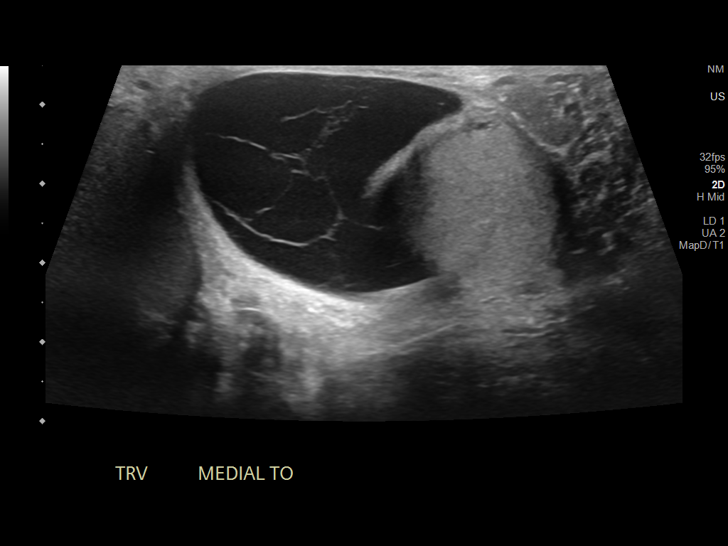
[im 47/52]
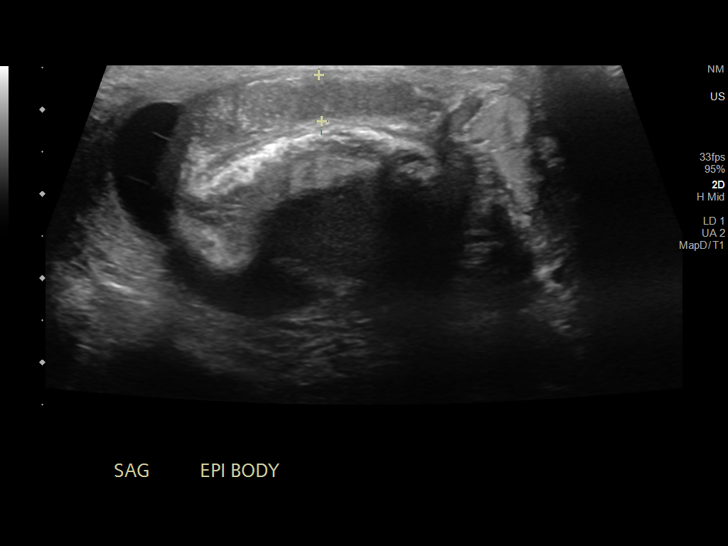
[im 52/52]
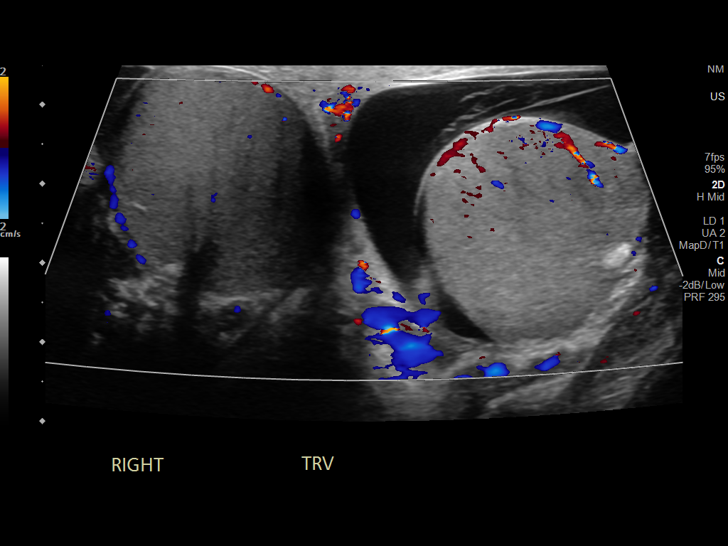

[13 of 25 positions shown; findings below may reference images not displayed]

FINDINGS: Right testicle

Measurements: 4.4 x 2.2 x 2.7 cm (volume 14 mL). No mass or
microlithiasis visualized.

Left testicle

Measurements: 4.5 x 2.7 x 3.0 cm (volume 19 mL). Echogenicity within
normal limits, no discrete testicular injury identified. No mass or
microlithiasis visualized.

Right epididymis:  Normal in size and appearance.

Left epididymis: Asymmetrically enlarged and hypervascular (image
46). Hypervascular left spermatic cord also.

Hydrocele: Positive for moderate sized septated left-side hydrocele
(image 34). Low level internal echoes.

Varicocele:  None visualized.

Pulsed Doppler interrogation of both testes demonstrates normal low
resistance arterial and venous waveforms bilaterally. The left
testis does appear mildly hypervascular (image 53).
IMPRESSION: 1. Negative for discrete testicular injury or torsion.
2. Positive for left-side septated hydrocele, which might be
sequelae of post traumatic hemorrhage, or could be postinflammatory
(see #3).
3. Positive also for asymmetrically enlarged and hyperemic left
epididymis, and some hyperemia of the left testis, which are
suspicious for Acute Epididymo-orchitis.

## 2022-08-27 ENCOUNTER — Emergency Department (HOSPITAL_BASED_OUTPATIENT_CLINIC_OR_DEPARTMENT_OTHER)
Admission: EM | Admit: 2022-08-27 | Discharge: 2022-08-27 | Disposition: A | Discharge status: Home / Self Care | Payer: Commercial Managed Care - PPO | Attending: Emergency Medicine | Admitting: Emergency Medicine

## 2022-08-27 ENCOUNTER — Emergency Department (HOSPITAL_BASED_OUTPATIENT_CLINIC_OR_DEPARTMENT_OTHER): Payer: Commercial Managed Care - PPO

## 2022-08-27 ENCOUNTER — Other Ambulatory Visit: Payer: Self-pay

## 2022-08-27 ENCOUNTER — Encounter (HOSPITAL_BASED_OUTPATIENT_CLINIC_OR_DEPARTMENT_OTHER): Payer: Self-pay | Admitting: Emergency Medicine

## 2022-08-27 DIAGNOSIS — M25512 Pain in left shoulder: Secondary | ICD-10-CM | POA: Diagnosis not present

## 2022-08-27 DIAGNOSIS — Y92009 Unspecified place in unspecified non-institutional (private) residence as the place of occurrence of the external cause: Secondary | ICD-10-CM | POA: Insufficient documentation

## 2022-08-27 DIAGNOSIS — M25511 Pain in right shoulder: Secondary | ICD-10-CM

## 2022-08-27 DIAGNOSIS — Y9389 Activity, other specified: Secondary | ICD-10-CM | POA: Diagnosis not present

## 2022-08-27 DIAGNOSIS — S4991XA Unspecified injury of right shoulder and upper arm, initial encounter: Secondary | ICD-10-CM | POA: Diagnosis present

## 2022-08-27 DIAGNOSIS — W1830XA Fall on same level, unspecified, initial encounter: Secondary | ICD-10-CM | POA: Insufficient documentation

## 2022-08-27 MED ORDER — TRAMADOL HCL 50 MG PO TABS: 50.0000 mg | ORAL_TABLET | Freq: Four times a day (QID) | ORAL | 0 refills | Status: AC | PRN

## 2022-08-27 NOTE — ED Provider Notes (Signed)
[facility] EMERGENCY DEPARTMENT Provider Note   CSN: [id number] Arrival date & time: 08/27/22  1416     History  Chief Complaint  Patient presents with   Shoulder Pain    Chase Shepard is a 61 y.o. male.  The history is provided by the patient and medical records. No language interpreter was used.  Shoulder Pain Location:  Shoulder Shoulder location:  R shoulder and L shoulder Injury: yes   Time since incident:  2 weeks Mechanism of injury: fall   Fall:    Fall occurred:  Standing   Impact surface:  Hard floor   Entrapped after fall: no   Pain details:    Quality:  Sharp   Radiates to:  Does not radiate   Severity:  Moderate   Onset quality:  Sudden   Timing:  Constant   Progression:  Unchanged Tetanus status:  Unknown Prior injury to area:  No Relieved by:  Nothing Worsened by:  Nothing Ineffective treatments:  None tried Associated symptoms: no back pain, no decreased range of motion, no fatigue, no fever, no muscle weakness, no neck pain, no numbness, no stiffness, no swelling and no tingling        Home Medications Prior to Admission medications   Medication Sig Start Date End Date Taking? Authorizing Provider  acetaminophen (TYLENOL) 500 MG tablet Take 1,000 mg by mouth every 6 (six) hours as needed. For pain    [provider]  naproxen (NAPROSYN) 500 MG tablet Take 1 tablet (500 mg total) by mouth 2 (two) times daily as needed. 05/17/20   [name], MD      Allergies    Patient has no known allergies.    Review of Systems   Review of Systems  Constitutional:  Negative for chills, fatigue and fever.  HENT:  Negative for congestion.   Respiratory:  Negative for cough, chest tightness and shortness of breath.   Cardiovascular:  Negative for chest pain and palpitations.  Gastrointestinal:  Negative for abdominal pain, constipation, diarrhea, nausea and vomiting.  Genitourinary:  Negative for flank pain.  Musculoskeletal:   Negative for back pain, neck pain, neck stiffness and stiffness.  Skin:  Negative for rash and wound.  Neurological:  Negative for dizziness, light-headedness and headaches.  Psychiatric/Behavioral:  Negative for agitation and confusion.   All other systems reviewed and are negative.   Physical Exam Updated Vital Signs BP (!) 141/95 (BP Location: Right Arm)   Pulse 62   Temp 98.3 F (36.8 C) (Oral)   Resp 17   Ht 5\' 6"  (1.676 m)   Wt 82 kg   SpO2 99%   BMI 29.18 kg/m  Physical Exam Vitals and nursing note reviewed.  Constitutional:      General: He is not in acute distress.    Appearance: He is well-developed.  HENT:     Head: Normocephalic and atraumatic.     Nose: Nose normal.     Mouth/Throat:     Mouth: Mucous membranes are moist.  Eyes:     Conjunctiva/sclera: Conjunctivae normal.  Cardiovascular:     Rate and Rhythm: Normal rate and regular rhythm.     Heart sounds: No murmur heard. Pulmonary:     Effort: Pulmonary effort is normal. No respiratory distress.     Breath sounds: Normal breath sounds. No rhonchi or rales.  Chest:     Chest wall: No tenderness.  Abdominal:     Palpations: Abdomen is soft.  Tenderness: There is no abdominal tenderness. There is no guarding or rebound.  Musculoskeletal:        General: Tenderness present. No swelling.     Right shoulder: Tenderness present.     Left shoulder: Tenderness present.       Arms:     Cervical back: Neck supple. No tenderness.     Right lower leg: No edema.     Left lower leg: No edema.     Comments: Intact sensation, strength, pulses, and range of motion in both arms.  Tenderness in the right shoulder more than left.  No crepitance appreciated.  No lacerations or skin changes.  Skin:    General: Skin is warm and dry.     Capillary Refill: Capillary refill takes less than 2 seconds.     Findings: No erythema or rash.  Neurological:     General: No focal deficit present.     Mental Status: He is  alert.     Sensory: No sensory deficit.     Motor: No weakness.  Psychiatric:        Mood and Affect: Mood normal.     ED Results / Procedures / Treatments   Labs (all labs ordered are listed, but only abnormal results are displayed) Labs Reviewed - No data to display  EKG None  Radiology DG Shoulder Left  Result Date: 08/27/2022 CLINICAL DATA:  BILATERAL shoulder pain for 3 weeks, no injury EXAM: LEFT SHOULDER - 2+ VIEW COMPARISON:  None FINDINGS: Osseous mineralization normal. AC joint alignment normal. No acute fracture, dislocation or bone destruction. Visualized ribs unremarkable. IMPRESSION: Normal exam. Electronically Signed   By: Ulyses Southward M.D.   On: 08/27/2022 14:53   DG Shoulder Right  Result Date: 08/27/2022 CLINICAL DATA:  BILATERAL shoulder pain for 3 weeks, no injury EXAM: RIGHT SHOULDER - 2+ VIEW COMPARISON:  None FINDINGS: Osseous mineralization normal. Slight widening of AC joint. Visualized ribs intact. Glenohumeral joint alignment normal. No fracture, dislocation, or bone destruction. IMPRESSION: Slight widening of the RIGHT AC joint which could reflect prior low-grade AC joint injury. Otherwise negative exam. Electronically Signed   By: Ulyses Southward M.D.   On: 08/27/2022 14:49    Procedures Procedures    Medications Ordered in ED Medications - No data to display  ED Course/ Medical Decision Making/ A&P                           Medical Decision Making Amount and/or Complexity of Data Reviewed Radiology: ordered.  Risk Prescription drug management.    Chase Shepard is a 61 y.o. male with no significant past medical history who presents with shoulder injuries.  According to patient, 2 weeks ago he was lifting a washer and had a pain in his left shoulder.  He reports he was trying to take it easy with it but then while moving a suitcase a week and a half ago, a mouse jumped out causing him to get scared and fall backwards landing on his right shoulder.   He reports immediate onset of pain and more discomfort.  He denies any headache, neck pain, chest pain, back pain, or abdominal pain.  Denies any extremity pains in his lower extremities.  Reports no numbness, tingling, weakness of extremities.  Just has pain in the right shoulder worse than left.  No history of shoulder surgeries or previous injuries.  On exam, lungs clear and chest nontender.  Abdomen nontender.  Patient has intact sensation, strength, and pulses in upper extremities.  Normal range of motion of both shoulders but his right shoulder is quite tender to palpation.  No swelling or warmth.  No erythema to suggest infection.  No skin injury seen.  Exam otherwise unremarkable.  X-rays were obtained.  Left x-ray reassuring but right side shows some possible AC joint widening indicative of previous injury.  Given his description of discomfort and symptoms I do suspect he has an AC joint injury.  We will place in a sling and give him the prescription for some pain medicine use at night so he can sleep and follow-up with sports medicine  Patient agrees and has no other questions or concerns.  He understands return precautions.  Patient discharged in good condition.         Final Clinical Impression(s) / ED Diagnoses Final diagnoses:  Acromioclavicular joint injury, right, initial encounter  Acute pain of right shoulder  Acute pain of left shoulder    Rx / DC Orders ED Discharge Orders          Ordered    traMADol (ULTRAM) 50 MG tablet  Every 6 hours PRN        08/27/22 1756            Clinical Impression: 1. Acromioclavicular joint injury, right, initial encounter   2. Acute pain of right shoulder   3. Acute pain of left shoulder     Disposition: Discharge  Condition: Good  I have discussed the results, Dx and Tx plan with the pt(& family if present). He/she/they expressed understanding and agree(s) with the plan. Discharge instructions discussed at great  length. Strict return precautions discussed and pt &/or family have verbalized understanding of the instructions. No further questions at time of discharge.    Discharge Medication List as of 08/27/2022  5:57 PM     START taking these medications   Details  traMADol (ULTRAM) 50 MG tablet Take 1 tablet (50 mg total) by mouth every 6 (six) hours as needed., Starting Mon 08/27/2022, Normal        Follow Up: Myra Rude, MD [address] [place] [address] [phone]     [facility] [address] [id number] [place] [address] [phone]       Estela Vinal, Canary Brim, MD 08/27/22 [phone]

## 2022-08-27 NOTE — Discharge Instructions (Signed)
Your history, exam, and work-up today are consistent with a right acromioclavicular injury on the right shoulder and left musculoskeletal pain after your shoulder injuries over the last 2 weeks.  The x-ray of the left side was normal but the right side showed possible right acromioclavicular injury.  Please use the sling and pain medicine to help you rest and follow-up with sports medicine.  If any symptoms change or worsen acutely, please return to the nearest emergency department.

## 2022-08-27 NOTE — ED Triage Notes (Signed)
Pt c/o BL shoulder injuries after moving heavy appliances at home 3 weeks ago.

## 2022-12-22 ENCOUNTER — Other Ambulatory Visit: Payer: Self-pay

## 2022-12-22 ENCOUNTER — Emergency Department (HOSPITAL_BASED_OUTPATIENT_CLINIC_OR_DEPARTMENT_OTHER)
Admission: EM | Admit: 2022-12-22 | Discharge: 2022-12-22 | Disposition: A | Discharge status: Home / Self Care | Payer: Commercial Managed Care - PPO | Attending: Emergency Medicine | Admitting: Emergency Medicine

## 2022-12-22 ENCOUNTER — Encounter (HOSPITAL_BASED_OUTPATIENT_CLINIC_OR_DEPARTMENT_OTHER): Payer: Self-pay | Admitting: Emergency Medicine

## 2022-12-22 DIAGNOSIS — M25511 Pain in right shoulder: Secondary | ICD-10-CM | POA: Diagnosis not present

## 2022-12-22 MED ORDER — NAPROXEN 500 MG PO TABS: 500.0000 mg | ORAL_TABLET | Freq: Two times a day (BID) | ORAL | 0 refills | Status: AC | PRN

## 2022-12-22 NOTE — ED Triage Notes (Signed)
Patient c/o right shoulder pain for approx. 2 months, states has been seen for same. Patient states he is unable to lift his arm all the way up. Denies any known injury.

## 2022-12-22 NOTE — Discharge Instructions (Signed)
It is important for you to follow-up with an orthopedist specialist for further evaluation of your recurrent right shoulder pain.  You may need further physical therapy to help improve your discomfort.  You may take naproxen prescribed as needed for pain.

## 2022-12-22 NOTE — ED Provider Notes (Signed)
[facility] EMERGENCY DEPARTMENT Provider Note   CSN: [id number] Arrival date & time: 12/22/22  1422     History  Chief Complaint  Patient presents with   Shoulder Pain    Chase Shepard is a 61 y.o. male.  The history is provided by the patient and medical records. No language interpreter was used.  Shoulder Pain    61 year old male who presents with complaints of shoulder pain.  Patient report he has had recurrent pain about his right shoulder.  This has been an ongoing issue for months.  States he was seen evaluated for this in the past and did receive some medication with some improvement but now pain returns.  He is having increasing pain with range of motion and he noticed more pain especially when he sleeps.  He does not endorse any associated chest pain or shortness of breath no neck pain no fever or chills and denies any specific injury.  He does not endorse any numbness or weakness down his arm.  He is right-hand dominant.  Home Medications Prior to Admission medications   Medication Sig Start Date End Date Taking? Authorizing Provider  acetaminophen (TYLENOL) 500 MG tablet Take 1,000 mg by mouth every 6 (six) hours as needed. For pain    [provider]  naproxen (NAPROSYN) 500 MG tablet Take 1 tablet (500 mg total) by mouth 2 (two) times daily as needed. 05/17/20   [name], MD  traMADol (ULTRAM) 50 MG tablet Take 1 tablet (50 mg total) by mouth every 6 (six) hours as needed. 08/27/22   Tegeler, Canary Brim, MD      Allergies    Patient has no known allergies.    Review of Systems   Review of Systems  All other systems reviewed and are negative.   Physical Exam Updated Vital Signs BP (!) 152/94 (BP Location: Right Arm)   Pulse 71   Temp 97.9 F (36.6 C) (Oral)   Resp 18   Ht 5\' 6"  (1.676 m)   Wt 78.9 kg   SpO2 98%   BMI 28.08 kg/m  Physical Exam Vitals and nursing note reviewed.  Constitutional:      General: He is not in  acute distress.    Appearance: He is well-developed.  HENT:     Head: Atraumatic.  Eyes:     Conjunctiva/sclera: Conjunctivae normal.  Musculoskeletal:        General: Tenderness (Right shoulder: Tenderness about the [facility] joint, as well as the lateral deltoid.  Patient has difficulty with range of motion especially to shoulder raise, with abduction and with full shoulder extension but no obvious deformity noted.  Sensation is intact) present.     Cervical back: Neck supple.  Skin:    Capillary Refill: Capillary refill takes less than 2 seconds.     Findings: No rash.  Neurological:     Mental Status: He is alert.     ED Results / Procedures / Treatments   Labs (all labs ordered are listed, but only abnormal results are displayed) Labs Reviewed - No data to display  EKG None  Radiology No results found.  Procedures Procedures    Medications Ordered in ED Medications - No data to display  ED Course/ Medical Decision Making/ A&P                           Medical Decision Making  BP (!) 152/94 (BP Location: Right Arm)  Pulse 71   Temp 97.9 F (36.6 C) (Oral)   Resp 18   Ht 5\' 6"  (1.676 m)   Wt 78.9 kg   SpO2 98%   BMI 28.08 kg/m   61:62 PM 61 year old male who presents with complaints of shoulder pain.  Patient report he has had recurrent pain about his right shoulder.  This has been an ongoing issue for months.  States he was seen evaluated for this in the past and did receive some medication with some improvement but now pain returns.  He is having increasing pain with range of motion and he noticed more pain especially when he sleeps.  He does not endorse any associated chest pain or shortness of breath no neck pain no fever or chills and denies any specific injury.  He does not endorse any numbness or weakness down his arm.  He is right-hand dominant.  On exam, right shoulder with tenderness at the [facility] joint and to the lateral deltoid.  Patient has difficulty with  full range of motion both with active and passive.  He is activity raising his shoulder and with abduction.  He has trouble with full shoulder extension.  He does not have any obvious deformity and sensation is intact throughout.  My review EMR and patient was seen previously in August of this year for same presentation.  An x-ray obtained at that time shows a mild widening at the [facility] joint but otherwise an unremarkable x-ray.  Given that patient did not have any recent injury, repeat x-ray is not indicated.  I have low suspicion for fracture dislocation and have low suspicion for any Infectious etiology.  I have considered rotator cuff injury versus impingement syndrome versus internal derangement of the shoulder.  Will have patient follow-up with orthopedist for outpatient evaluation.  I have also considered adhesive capsulitis as patient does have some decrease in his range of motion.  Patient discharged home with naproxen as needed for pain.        Final Clinical Impression(s) / ED Diagnoses Final diagnoses:  Acute pain of right shoulder    Rx / DC Orders ED Discharge Orders          Ordered    naproxen (NAPROSYN) 500 MG tablet  2 times daily PRN        12/22/22 1635              Domenic Moras, PA-C 12/22/22 1637    Tegeler, Gwenyth Allegra, MD 12/22/22 [phone]
